# Patient Record
Sex: Male | Born: 1935 | Race: White | Hispanic: No | Marital: Married
Health system: Southern US, Community
[De-identification: ages and names within clinical notes are randomized; demographics above are authoritative.]

## PROBLEM LIST (undated history)

## (undated) DIAGNOSIS — I10 Essential (primary) hypertension: Secondary | ICD-10-CM

## (undated) DIAGNOSIS — I4891 Unspecified atrial fibrillation: Secondary | ICD-10-CM

## (undated) DIAGNOSIS — M109 Gout, unspecified: Secondary | ICD-10-CM

---

## 2007-02-19 ENCOUNTER — Emergency Department: Payer: Self-pay | Admitting: Emergency Medicine

## 2012-01-16 ENCOUNTER — Emergency Department: Payer: Self-pay | Admitting: Emergency Medicine

## 2012-01-16 LAB — CBC
HCT: 38.9 % — ABNORMAL LOW (ref 40.0–52.0)
HGB: 13.2 g/dL (ref 13.0–18.0)
MCH: 37.7 pg — ABNORMAL HIGH (ref 26.0–34.0)
MCHC: 34 g/dL (ref 32.0–36.0)
MCV: 111 fL — ABNORMAL HIGH (ref 80–100)
RBC: 3.5 10*6/uL — ABNORMAL LOW (ref 4.40–5.90)

## 2012-01-16 LAB — COMPREHENSIVE METABOLIC PANEL
Calcium, Total: 11 mg/dL — ABNORMAL HIGH (ref 8.5–10.1)
Chloride: 105 mmol/L (ref 98–107)
Co2: 24 mmol/L (ref 21–32)
Creatinine: 1.87 mg/dL — ABNORMAL HIGH (ref 0.60–1.30)
Potassium: 4.7 mmol/L (ref 3.5–5.1)
Sodium: 141 mmol/L (ref 136–145)

## 2012-01-17 LAB — URINALYSIS, COMPLETE
Bacteria: NONE SEEN
Blood: NEGATIVE
Hyaline Cast: 4
Leukocyte Esterase: NEGATIVE
Nitrite: NEGATIVE
Ph: 6 (ref 4.5–8.0)
Squamous Epithelial: 1

## 2012-01-17 LAB — CK TOTAL AND CKMB (NOT AT ARMC)
CK, Total: 117 U/L (ref 35–232)
CK-MB: 5.6 ng/mL — ABNORMAL HIGH (ref 0.5–3.6)

## 2012-02-02 ENCOUNTER — Ambulatory Visit: Payer: Medicare Other | Admitting: *Deleted

## 2012-02-10 ENCOUNTER — Other Ambulatory Visit: Payer: Self-pay | Admitting: Podiatry

## 2012-02-10 ENCOUNTER — Ambulatory Visit: Payer: Self-pay | Admitting: Podiatry

## 2012-02-14 LAB — WOUND AEROBIC CULTURE

## 2012-02-20 ENCOUNTER — Other Ambulatory Visit: Payer: Self-pay | Admitting: Ophthalmology

## 2012-02-20 LAB — POTASSIUM: Potassium: 3.8 mmol/L (ref 3.5–5.1)

## 2012-03-09 ENCOUNTER — Ambulatory Visit: Payer: Self-pay | Admitting: Ophthalmology

## 2012-05-05 ENCOUNTER — Ambulatory Visit: Payer: Self-pay | Admitting: Unknown Physician Specialty

## 2012-06-03 ENCOUNTER — Emergency Department: Payer: Self-pay | Admitting: Emergency Medicine

## 2012-06-03 LAB — COMPREHENSIVE METABOLIC PANEL
Albumin: 3.5 g/dL (ref 3.4–5.0)
Alkaline Phosphatase: 77 U/L (ref 50–136)
Anion Gap: 11 (ref 7–16)
BUN: 34 mg/dL — ABNORMAL HIGH (ref 7–18)
Creatinine: 1.41 mg/dL — ABNORMAL HIGH (ref 0.60–1.30)
Glucose: 204 mg/dL — ABNORMAL HIGH (ref 65–99)
Osmolality: 285 (ref 275–301)
Potassium: 4.6 mmol/L (ref 3.5–5.1)
Sodium: 136 mmol/L (ref 136–145)
Total Protein: 7.8 g/dL (ref 6.4–8.2)

## 2012-06-03 LAB — URINALYSIS, COMPLETE
Blood: NEGATIVE
Glucose,UR: NEGATIVE mg/dL (ref 0–75)
Leukocyte Esterase: NEGATIVE
Nitrite: NEGATIVE
Ph: 5 (ref 4.5–8.0)
Protein: 30

## 2012-06-03 LAB — CK TOTAL AND CKMB (NOT AT ARMC)
CK, Total: 48 U/L (ref 35–232)
CK-MB: 2 ng/mL (ref 0.5–3.6)

## 2012-06-03 LAB — CBC
HGB: 14 g/dL (ref 13.0–18.0)
MCH: 37.8 pg — ABNORMAL HIGH (ref 26.0–34.0)
Platelet: 178 10*3/uL (ref 150–440)
RDW: 15.2 % — ABNORMAL HIGH (ref 11.5–14.5)

## 2012-06-07 ENCOUNTER — Inpatient Hospital Stay: Payer: Self-pay | Admitting: Internal Medicine

## 2012-06-07 LAB — CBC
HCT: 38.5 % — ABNORMAL LOW (ref 40.0–52.0)
HGB: 12.9 g/dL — ABNORMAL LOW (ref 13.0–18.0)
MCHC: 34.1 g/dL (ref 32.0–36.0)
MCV: 114 fL — ABNORMAL HIGH (ref 80–100)
MCV: 114 fL — ABNORMAL HIGH (ref 80–100)
Platelet: 159 10*3/uL (ref 150–440)
RBC: 3.37 10*6/uL — ABNORMAL LOW (ref 4.40–5.90)
WBC: 16.1 10*3/uL — ABNORMAL HIGH (ref 3.8–10.6)

## 2012-06-07 LAB — URINALYSIS, COMPLETE
Bilirubin,UR: NEGATIVE
Blood: NEGATIVE
Glucose,UR: NEGATIVE mg/dL (ref 0–75)
Leukocyte Esterase: NEGATIVE
Ph: 5 (ref 4.5–8.0)
Specific Gravity: 1.016 (ref 1.003–1.030)
Squamous Epithelial: NONE SEEN

## 2012-06-07 LAB — COMPREHENSIVE METABOLIC PANEL
Albumin: 3.4 g/dL (ref 3.4–5.0)
BUN: 46 mg/dL — ABNORMAL HIGH (ref 7–18)
Chloride: 106 mmol/L (ref 98–107)
Co2: 26 mmol/L (ref 21–32)
Creatinine: 1.62 mg/dL — ABNORMAL HIGH (ref 0.60–1.30)
SGPT (ALT): 23 U/L
Sodium: 140 mmol/L (ref 136–145)

## 2012-06-07 LAB — CK TOTAL AND CKMB (NOT AT ARMC)
CK, Total: 59 U/L (ref 35–232)
CK-MB: 2.7 ng/mL (ref 0.5–3.6)

## 2012-06-07 LAB — TROPONIN I: Troponin-I: 0.03 ng/mL

## 2012-06-08 LAB — CBC WITH DIFFERENTIAL/PLATELET
Basophil %: 0.2 %
Eosinophil %: 3.3 %
HGB: 11.2 g/dL — ABNORMAL LOW (ref 13.0–18.0)
Lymphocyte #: 1.8 10*3/uL (ref 1.0–3.6)
MCH: 39.3 pg — ABNORMAL HIGH (ref 26.0–34.0)
MCV: 113 fL — ABNORMAL HIGH (ref 80–100)
Monocyte #: 1 x10 3/mm (ref 0.2–1.0)
Monocyte %: 10.8 %
Neutrophil %: 66.5 %
Platelet: 118 10*3/uL — ABNORMAL LOW (ref 150–440)
RBC: 2.84 10*6/uL — ABNORMAL LOW (ref 4.40–5.90)
WBC: 9.2 10*3/uL (ref 3.8–10.6)

## 2012-06-08 LAB — COMPREHENSIVE METABOLIC PANEL
Albumin: 2.7 g/dL — ABNORMAL LOW (ref 3.4–5.0)
BUN: 31 mg/dL — ABNORMAL HIGH (ref 7–18)
Bilirubin,Total: 1.1 mg/dL — ABNORMAL HIGH (ref 0.2–1.0)
EGFR (African American): >60
Glucose: 117 mg/dL — ABNORMAL HIGH (ref 65–99)
SGOT(AST): 31 U/L (ref 15–37)
SGPT (ALT): 19 U/L
Total Protein: 6 g/dL — ABNORMAL LOW (ref 6.4–8.2)

## 2012-06-08 LAB — PROTIME-INR: Prothrombin Time: 15.4 secs — ABNORMAL HIGH (ref 11.5–14.7)

## 2012-06-09 LAB — CBC WITH DIFFERENTIAL/PLATELET
Eosinophil %: 5 %
Lymphocyte #: 1.7 10*3/uL (ref 1.0–3.6)
MCV: 113 fL — ABNORMAL HIGH (ref 80–100)
Monocyte %: 13.3 %
Platelet: 112 10*3/uL — ABNORMAL LOW (ref 150–440)
RBC: 2.81 10*6/uL — ABNORMAL LOW (ref 4.40–5.90)
WBC: 7.2 10*3/uL (ref 3.8–10.6)

## 2012-06-26 ENCOUNTER — Encounter (HOSPITAL_COMMUNITY): Payer: Self-pay | Admitting: *Deleted

## 2012-06-26 ENCOUNTER — Emergency Department (HOSPITAL_COMMUNITY)
Admission: EM | Admit: 2012-06-26 | Discharge: 2012-06-27 | Disposition: A | Discharge status: Home / Self Care | Payer: Medicare Other | Attending: Emergency Medicine | Admitting: Emergency Medicine

## 2012-06-26 DIAGNOSIS — Z79899 Other long term (current) drug therapy: Secondary | ICD-10-CM | POA: Insufficient documentation

## 2012-06-26 DIAGNOSIS — E860 Dehydration: Secondary | ICD-10-CM

## 2012-06-26 DIAGNOSIS — K5730 Diverticulosis of large intestine without perforation or abscess without bleeding: Secondary | ICD-10-CM | POA: Insufficient documentation

## 2012-06-26 DIAGNOSIS — R112 Nausea with vomiting, unspecified: Secondary | ICD-10-CM | POA: Insufficient documentation

## 2012-06-26 DIAGNOSIS — I10 Essential (primary) hypertension: Secondary | ICD-10-CM | POA: Insufficient documentation

## 2012-06-26 DIAGNOSIS — E119 Type 2 diabetes mellitus without complications: Secondary | ICD-10-CM | POA: Insufficient documentation

## 2012-06-26 DIAGNOSIS — N289 Disorder of kidney and ureter, unspecified: Secondary | ICD-10-CM | POA: Insufficient documentation

## 2012-06-26 HISTORY — DX: Unspecified atrial fibrillation: I48.91

## 2012-06-26 HISTORY — DX: Gout, unspecified: M10.9

## 2012-06-26 HISTORY — DX: Essential (primary) hypertension: I10

## 2012-06-26 NOTE — ED Notes (Signed)
Pt states that he has been intermittantly vomiting for the past month. Pt states that there are a few days that he goes without vomiting but then there are days when he vomits multiple times. Pt states vomited 3 times today. Pt son states gave pt medications tonight unsure how many he kept down.

## 2012-06-26 NOTE — ED Provider Notes (Signed)
History     CSN: [id number]  Arrival date & time 06/26/12  2149   First MD Initiated Contact with Patient 06/26/12 2321      Chief Complaint  Patient presents with  . Emesis    (Consider location/radiation/quality/duration/timing/severity/associated sxs/prior treatment) HPI 78 year old male presents to the emergency department in the company of his son with complaint of nausea and vomiting intermittently over the last month. Patient was seen at [facility] emergency department twice, admitted once. They're unsure what interventions were done for the patient. They report he has had persistent symptoms despite nausea medication. No fever no chills no abdominal pain. Vomiting happens without relation to eating or drinking. Today, patient has vomited 3 times. Family thinks he has had a 10 pound weight loss. No prior history of gastroenterologist evaluation. Patient with history of diabetes, denies history of gastroparesis. No prior surgeries. Patient also complaining of ongoing left leg pain, has been told he has gout and Baker's cyst. No change in his ongoing pain in the knee/leg  Past Medical History  Diagnosis Date  . Diabetes mellitus   . Hypertension   . Gout   . A-fib     History reviewed. No pertinent past surgical history.  History reviewed. No pertinent family history.  History  Substance Use Topics  . Smoking status: Not on file  . Smokeless tobacco: Not on file  . Alcohol Use:       Review of Systems  All other systems reviewed and are negative.  other than listed in HPI  Allergies  Review of patient's allergies indicates no known allergies.  Home Medications   Current Outpatient Rx  Name Route Sig Dispense Refill  . ALLOPURINOL 100 MG PO TABS Oral Take 100 mg by mouth daily.    Marland Kitchen DILTIAZEM HCL ER 240 MG PO CP24 Oral Take 240 mg by mouth daily.    Marland Kitchen HYDROCHLOROTHIAZIDE 25 MG PO TABS Oral Take 25 mg by mouth daily.    Marland Kitchen HYDROCODONE-ACETAMINOPHEN  10-325 MG PO TABS Oral Take 1 tablet by mouth 2 (two) times daily as needed. For pain    . METFORMIN HCL ER 500 MG PO TB24 Oral Take 500-1,000 mg by mouth 2 (two) times daily. Take 500mg  in AM and 1000mg  in PM    . ONDANSETRON 4 MG PO TBDP Oral Take 4 mg by mouth every 8 (eight) hours as needed. For nausea    . PANTOPRAZOLE SODIUM 40 MG PO TBEC Oral Take 40 mg by mouth daily.    . QUINAPRIL HCL 40 MG PO TABS Oral Take 40 mg by mouth at bedtime.      BP 136/61  Temp 98.3 F (36.8 C) (Oral)  Resp 18  SpO2 100%  Physical Exam  Nursing note and vitals reviewed. Constitutional: He is oriented to person, place, and time. He appears well-developed. No distress.       Thin appearing  HENT:  Head: Normocephalic and atraumatic.  Nose: Nose normal.  Mouth/Throat: Oropharynx is clear and moist.  Eyes: Conjunctivae and EOM are normal. Pupils are equal, round, and reactive to light.  Neck: Normal range of motion. Neck supple. No JVD present. No tracheal deviation present. No thyromegaly present.  Cardiovascular: Normal rate, regular rhythm, normal heart sounds and intact distal pulses.  Exam reveals no gallop and no friction rub.   No murmur heard. Pulmonary/Chest: Effort normal and breath sounds normal. No stridor. No respiratory distress. He has no wheezes. He has no rales. He exhibits no  tenderness.  Abdominal: Soft. Bowel sounds are normal. He exhibits no distension and no mass. There is no tenderness. There is no rebound and no guarding.  Musculoskeletal: Normal range of motion. He exhibits no edema and no tenderness.  Lymphadenopathy:    He has no cervical adenopathy.  Neurological: He is alert and oriented to person, place, and time. He exhibits normal muscle tone. Coordination normal.  Skin: Skin is warm and dry. No rash noted. He is not diaphoretic. No erythema. No pallor.  Psychiatric: He has a normal mood and affect. His behavior is normal. Judgment and thought content normal.    ED  Course  Procedures (including critical care time)  Labs Reviewed  COMPREHENSIVE METABOLIC PANEL - Abnormal; Notable for the following:    Glucose, Bld 179 (*)     BUN 36 (*)     Creatinine, Ser 1.40 (*)     Calcium 11.6 (*)     Albumin 3.2 (*)     GFR calc non Af Amer 43 (*)     GFR calc Af Amer 49 (*)     All other components within normal limits  LIPASE, BLOOD - Abnormal; Notable for the following:    Lipase 61 (*)     All other components within normal limits  URINALYSIS, ROUTINE W REFLEX MICROSCOPIC - Abnormal; Notable for the following:    Bilirubin Urine SMALL (*)     Ketones, ur 15 (*)     All other components within normal limits  CBC WITH DIFFERENTIAL - Abnormal; Notable for the following:    WBC 13.1 (*)     RBC 3.43 (*)     HCT 37.0 (*)     MCV 107.9 (*)     MCH 38.5 (*)     Neutrophils Relative 84 (*)     Neutro Abs 11.1 (*)     Lymphocytes Relative 6 (*)     Monocytes Absolute 1.1 (*)     All other components within normal limits  GLUCOSE, CAPILLARY - Abnormal; Notable for the following:    Glucose-Capillary 169 (*)     All other components within normal limits   Ct Abdomen Pelvis Wo Contrast  06/27/2012  *RADIOLOGY REPORT*  Clinical Data: Nausea and vomiting.  CT ABDOMEN AND PELVIS WITHOUT CONTRAST  Technique:  Multidetector CT imaging of the abdomen and pelvis was performed following the standard protocol without intravenous contrast.  Comparison: None.  Findings: Fibrotic changes at the lung bases.  Large hiatal hernia.  Organ abnormality/lesion detection is limited in the absence of intravenous contrast. Within this limitation, unremarkable liver, biliary system, adrenal glands.  Splenic granuloma. Pancreas is poorly characterized.  No significant peripancreatic fat stranding. No peripancreatic fluid.  Renal calcifications and indeterminate renal lesions.  No hydronephrosis or hydroureter.  No bowel obstruction.  Colonic diverticulosis.  No definite evidence for  diverticulitis.  Appendix not identified.  No free intraperitoneal air or fluid.  There is scattered atherosclerotic calcification of the aorta and its branches. No aneurysmal dilatation. Nonspecific prominent perisplenic collateral vessels.  Partially decompressed bladder.  Prostatomegaly.  High-riding testicles.  Osteopenia.  Multilevel degenerative changes. No compression fracture with greater than 50% central height loss.  IMPRESSION:  Large hiatal hernia.  Colonic diverticulosis.  Age indeterminate L1 compression fracture without significant retropulsion.  Correlate with point tenderness.  Prominent perisplenic collateral vessels, nonspecific.  Further vascular evaluation is not possible given the absence of intravenous contrast.  Original Report Authenticated By: Waneta Martins, M.D.  1. Nausea and vomiting   2. Renal insufficiency   3. Dehydration       MDM  78 year old with ongoing nausea and vomiting. We'll get prior records from [facility]. We'll get baseline labs here. Patient is seen by Dr. Felipa Eth.  Pt with stable renal insufficiency from labs at , slight ketones in urine.  No acute findings on CT scan.  Pt able to tolerate all of the contrast, no further vomiting.  Will have himf/u with [facility] this week.       Olivia Mackie, MD 06/27/12 [id number]

## 2012-06-27 ENCOUNTER — Emergency Department (HOSPITAL_COMMUNITY): Payer: Medicare Other

## 2012-06-27 LAB — COMPREHENSIVE METABOLIC PANEL
AST: 30 U/L (ref 0–37)
Albumin: 3.2 g/dL — ABNORMAL LOW (ref 3.5–5.2)
Alkaline Phosphatase: 56 U/L (ref 39–117)
Chloride: 100 mEq/L (ref 96–112)
Potassium: 4.5 mEq/L (ref 3.5–5.1)
Sodium: 139 mEq/L (ref 135–145)
Total Bilirubin: 0.7 mg/dL (ref 0.3–1.2)
Total Protein: 7 g/dL (ref 6.0–8.3)

## 2012-06-27 LAB — CBC WITH DIFFERENTIAL/PLATELET
Basophils Relative: 0 % (ref 0–1)
HCT: 37 % — ABNORMAL LOW (ref 39.0–52.0)
Hemoglobin: 13.2 g/dL (ref 13.0–17.0)
Lymphs Abs: 0.8 10*3/uL (ref 0.7–4.0)
MCH: 38.5 pg — ABNORMAL HIGH (ref 26.0–34.0)
MCHC: 35.7 g/dL (ref 30.0–36.0)
Monocytes Absolute: 1.1 10*3/uL — ABNORMAL HIGH (ref 0.1–1.0)
Monocytes Relative: 8 % (ref 3–12)
Neutro Abs: 11.1 10*3/uL — ABNORMAL HIGH (ref 1.7–7.7)
Neutrophils Relative %: 84 % — ABNORMAL HIGH (ref 43–77)
RBC: 3.43 MIL/uL — ABNORMAL LOW (ref 4.22–5.81)

## 2012-06-27 LAB — URINALYSIS, ROUTINE W REFLEX MICROSCOPIC
Glucose, UA: NEGATIVE mg/dL
Ketones, ur: 15 mg/dL — AB
Leukocytes, UA: NEGATIVE
Nitrite: NEGATIVE
Specific Gravity, Urine: 1.02 (ref 1.005–1.030)
pH: 5.5 (ref 5.0–8.0)

## 2012-06-27 MED ORDER — IOHEXOL 300 MG/ML  SOLN
20.0000 mL | INTRAMUSCULAR | Status: AC
  Administered 2012-06-27: 20 mL via ORAL

## 2012-06-27 MED ORDER — SODIUM CHLORIDE 0.9 % IV BOLUS (SEPSIS)
1000.0000 mL | Freq: Once | INTRAVENOUS | Status: AC
  Administered 2012-06-27: 1000 mL via INTRAVENOUS

## 2012-06-27 MED ORDER — ONDANSETRON HCL 4 MG PO TABS: 4.0000 mg | ORAL_TABLET | Freq: Three times a day (TID) | ORAL | Status: AC

## 2012-06-27 NOTE — ED Notes (Signed)
The pt just had 2 cups of contrast brought.  One cup each hour

## 2012-06-27 NOTE — ED Notes (Signed)
Pt returned from ct

## 2012-06-27 NOTE — ED Notes (Signed)
To the br then to c-t

## 2012-06-27 NOTE — ED Notes (Signed)
Pt up to the br.  2nd cup of oral contrast  started

## 2012-06-27 NOTE — ED Notes (Signed)
The pt remains alert voiding frequently.,  His son is at the bedside. Iv started

## 2012-06-27 NOTE — ED Notes (Signed)
The pt has been ill for 3 weeks with intermittent nausea vomiting and diarrhea.  At present alert no acute distress

## 2012-06-27 NOTE — ED Notes (Signed)
Call made to c-t one hour ago telling them that the pt had finished his contrast.  sleeping

## 2012-08-31 DEATH — deceased

## 2013-04-13 IMAGING — CT CT ABD-PELV W/O CM
2 of 4 series · 14 of 32 positions shown, 19 images · non-contrast
Comparison: None.

CLINICAL DATA: Nausea and vomiting.

CT ABDOMEN AND PELVIS WITHOUT CONTRAST
TECHNIQUE: Multidetector CT imaging of the abdomen and pelvis was
performed following the standard protocol without intravenous
contrast.

[Series 2: routine abdomen · axial · 0.70mm/px · z∈[-381,-101]mm · 7 of 76 slices shown, 12 images]
[im 10/76  soft-tissue]
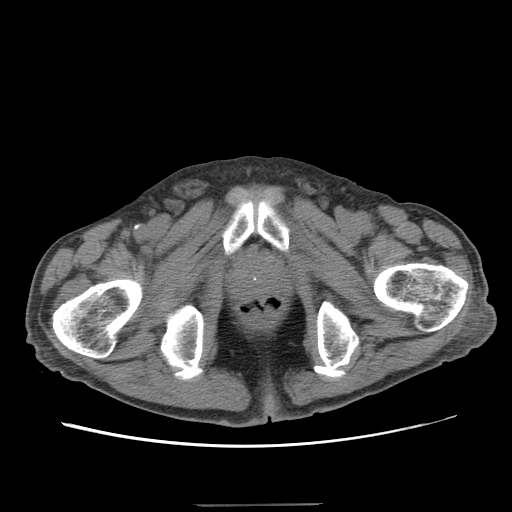
[im 10/76  bone]
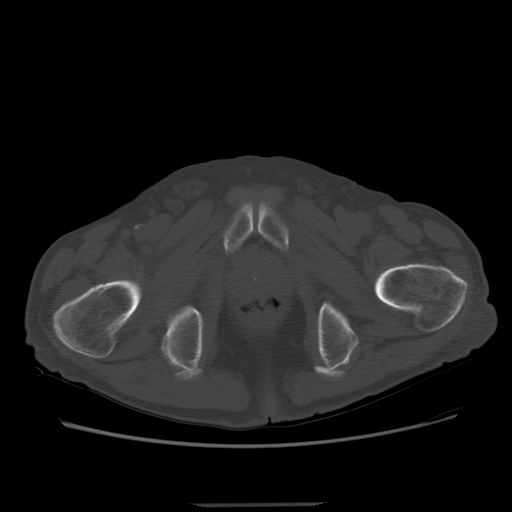
[im 19/76  soft-tissue]
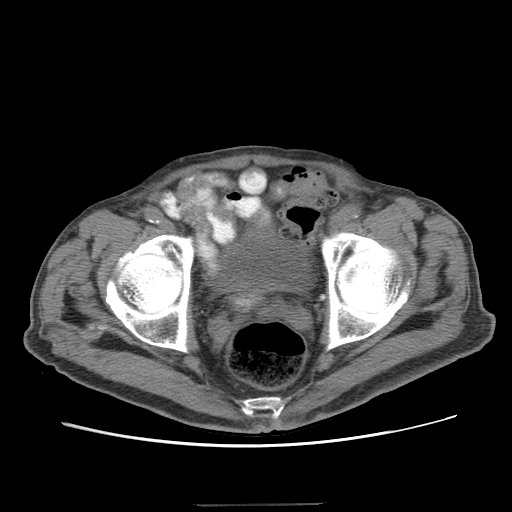
[im 29/76  soft-tissue]
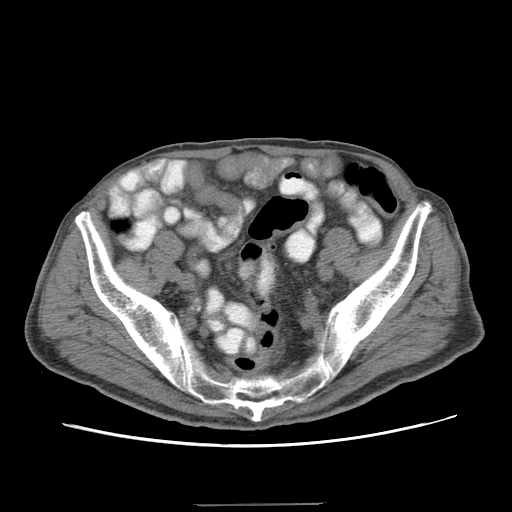
[im 38/76  soft-tissue]
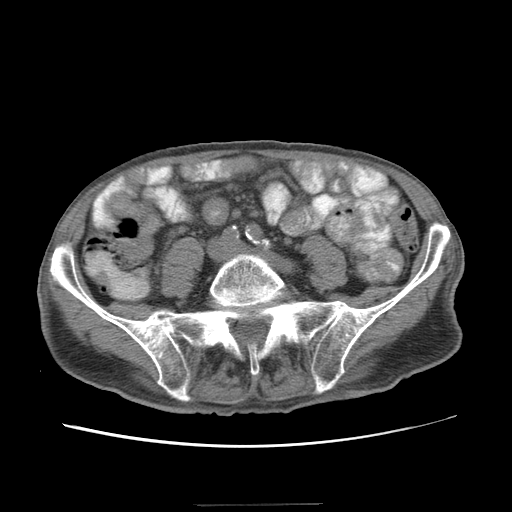
[im 38/76  lung]
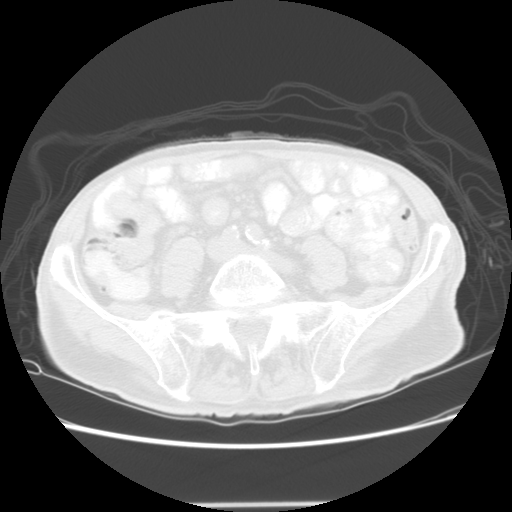
[im 47/76  soft-tissue]
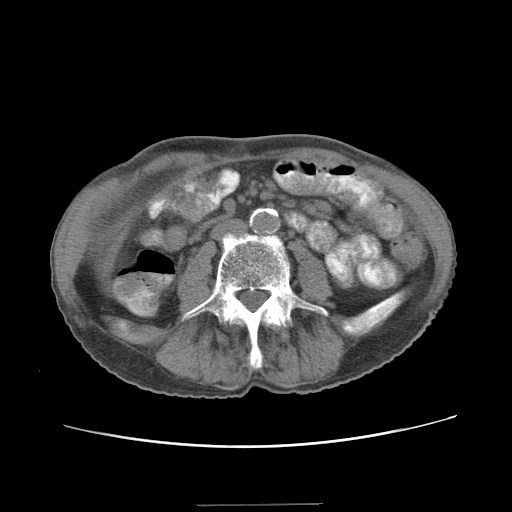
[im 47/76  lung]
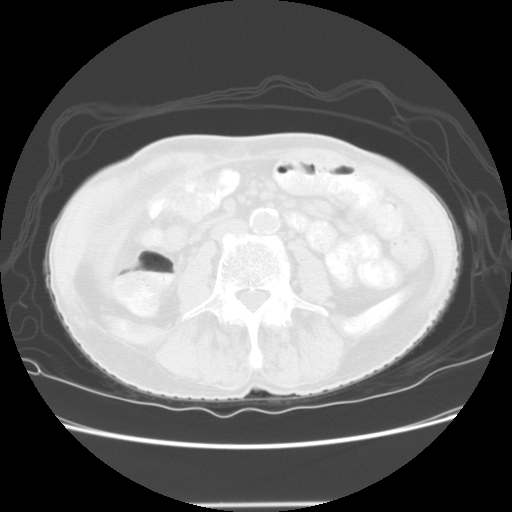
[im 57/76  soft-tissue]
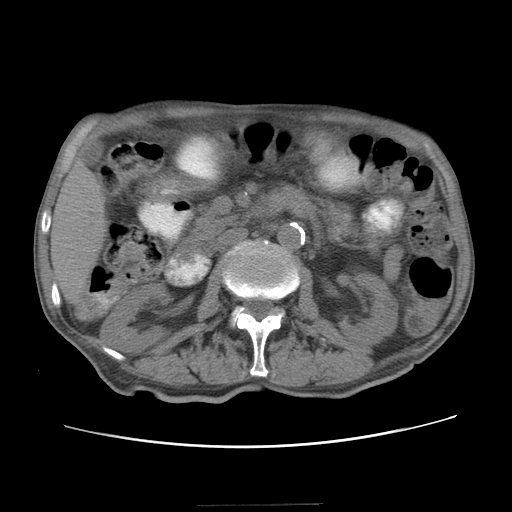
[im 57/76  lung]
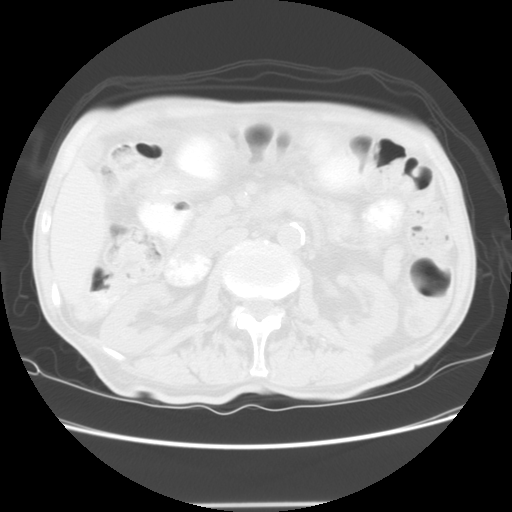
[im 66/76  soft-tissue]
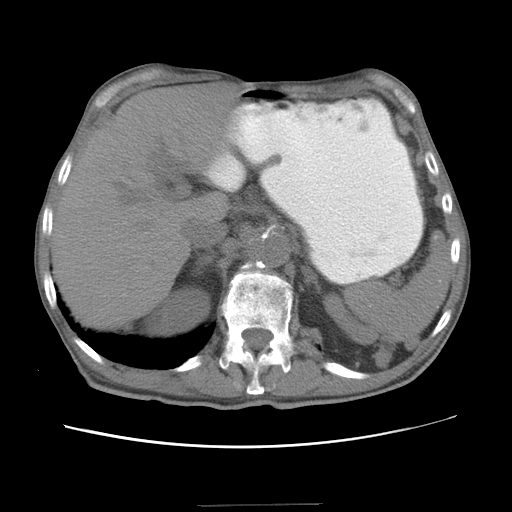
[im 66/76  lung]
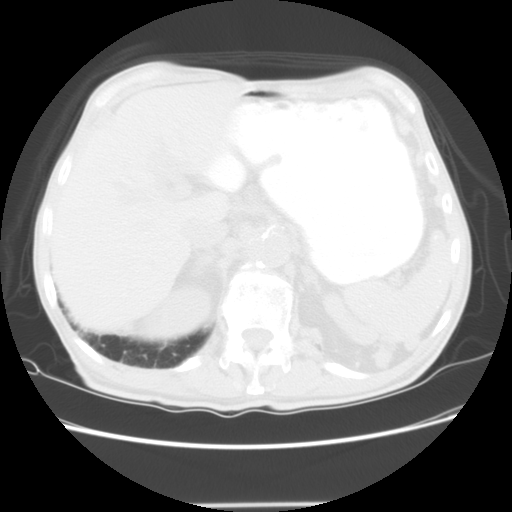

[Series 404: sagitals · sagittal · 0.75mm/px · 7 of 101 slices shown]
[im 11/101  soft-tissue]
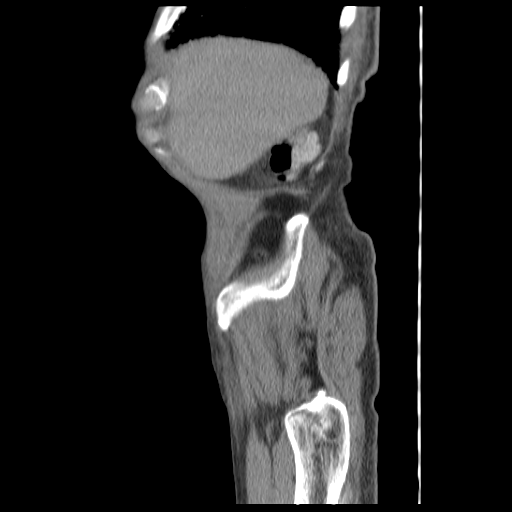
[im 21/101  soft-tissue]
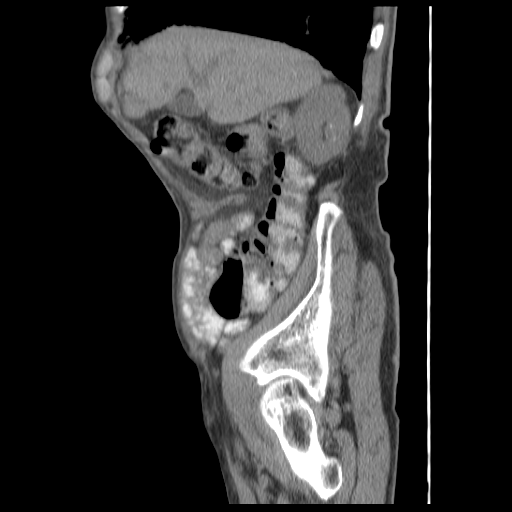
[im 31/101  soft-tissue]
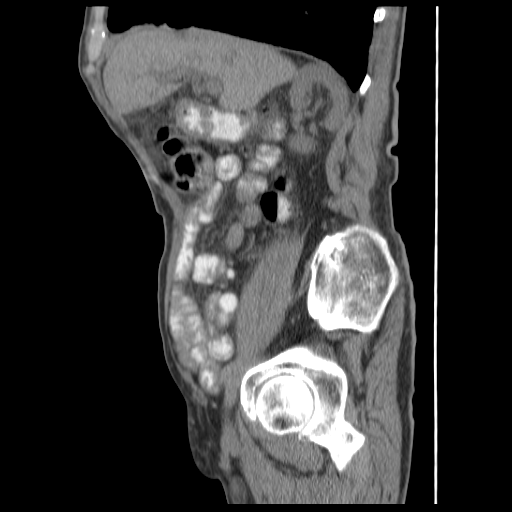
[im 41/101  soft-tissue]
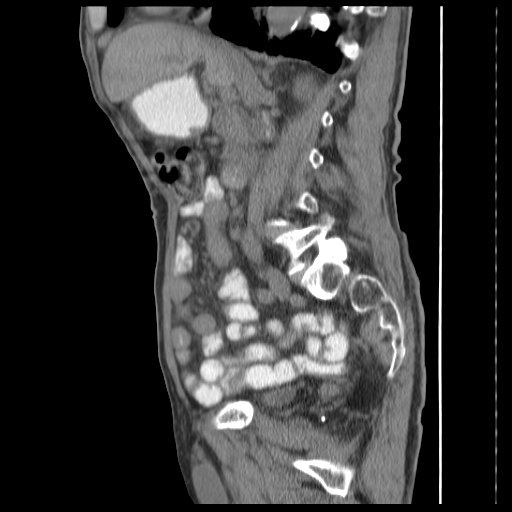
[im 61/101  soft-tissue]
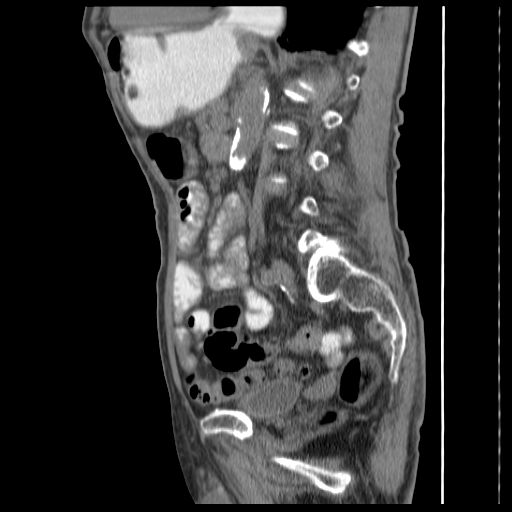
[im 71/101  soft-tissue]
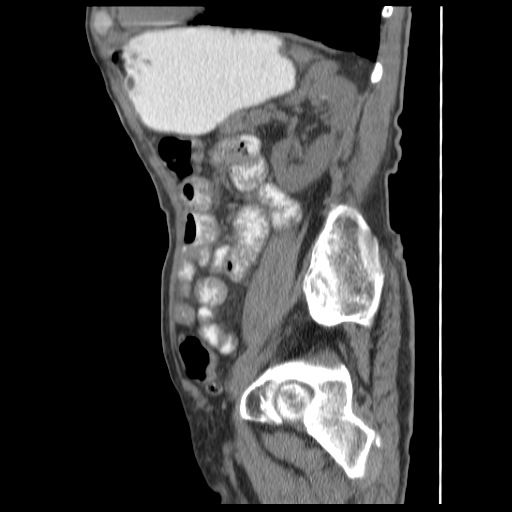
[im 81/101  soft-tissue]
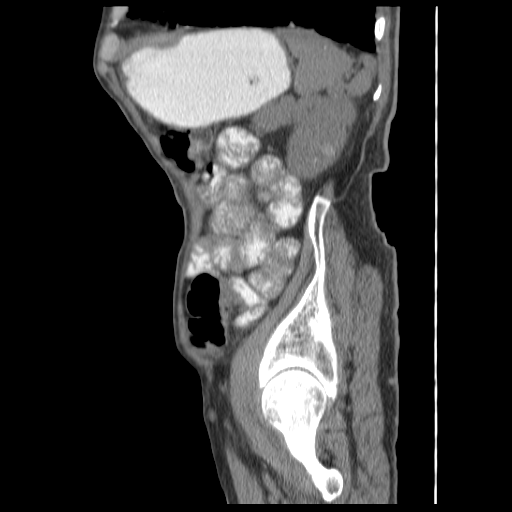

[14 of 32 positions shown; findings below may reference images not displayed]

FINDINGS: [Fibrotic changes at the lung bases.  Large hiatal
hernia.

Organ abnormality/lesion detection is limited in the absence of
intravenous contrast. Within this limitation, unremarkable liver,
biliary system, adrenal glands.  Splenic granuloma. Pancreas is
poorly characterized.  No significant peripancreatic fat stranding.
No peripancreatic fluid.

Renal calcifications and indeterminate renal lesions.  No
hydronephrosis or hydroureter.

No bowel obstruction.  Colonic diverticulosis.  No definite
evidence for diverticulitis.  Appendix not identified.  No free
intraperitoneal air or fluid.

There is scattered atherosclerotic calcification of the aorta and
its branches. No aneurysmal dilatation. Nonspecific prominent
perisplenic collateral vessels.

Partially decompressed bladder.  Prostatomegaly.  High-riding
testicles.

Osteopenia.  Multilevel degenerative changes.] No compression
fracture with greater than 50% central height loss.
IMPRESSION: Large hiatal hernia.

Colonic diverticulosis.

Age indeterminate L1 compression fracture without significant
retropulsion.  Correlate with point tenderness.

Prominent perisplenic collateral vessels, nonspecific.  Further
vascular evaluation is not possible given the absence of
intravenous contrast.

## 2015-03-25 NOTE — Op Note (Signed)
PATIENT NAME:  Samuel Brooks, [name] MR#:  [id number] DATE OF BIRTH:  January 20, 1920  DATE OF PROCEDURE:  03/09/2012  PREOPERATIVE DIAGNOSIS: Visually significant cataract of the left eye.   POSTOPERATIVE DIAGNOSIS: Visually significant cataract of the left eye.   OPERATIVE PROCEDURE: Cataract extraction by phacoemulsification with implant of intraocular lens to left eye.   SURGEON: Galen ManilaWilliam Paxton Kanaan, MD.   ANESTHESIA:  1. Managed anesthesia care.  2. Topical tetracaine drops followed by 2% Xylocaine jelly applied in the preoperative holding area.   COMPLICATIONS: None.   TECHNIQUE:  Stop-and-chop    DESCRIPTION OF PROCEDURE: The patient was examined and consented in the preoperative holding area where the aforementioned topical anesthesia was applied to the left eye and then brought back to the Operating Room where the left eye was prepped and draped in the usual sterile ophthalmic fashion and a lid speculum was placed. A paracentesis was created with the side port blade and the anterior chamber was filled with viscoelastic. A near clear corneal incision was performed with the steel keratome. A continuous curvilinear capsulorrhexis was performed with a cystotome followed by the capsulorrhexis forceps. Hydrodissection and hydrodelineation were carried out with BSS on a blunt cannula. The lens was removed in a stop-and-chop technique and the remaining cortical material was removed with the irrigation-aspiration handpiece. The capsular bag was inflated with viscoelastic and the Technus ZCB00  21.0-diopter lens, serial number [id number] was placed in the capsular bag without complication. The remaining viscoelastic was removed from the eye with the irrigation-aspiration handpiece. The wounds were hydrated. The anterior chamber was flushed with Miostat and the eye was inflated to physiologic pressure. The wounds were found to be water tight. The eye was dressed with Vigamox. The patient was given protective  glasses to wear throughout the day and a shield with which to sleep tonight. The patient was also given drops with which to begin a drop regimen today and will follow-up with me in one day.   ____________________________ Jerilee FieldWilliam L. Braian Tijerina, MD wlp:drc D: 03/09/2012 12:55:05 ET T: 03/09/2012 13:18:04 ET JOB#: [id number]  cc: Neveah Bang L. Smrithi Pigford, MD, <Dictator> Jerilee FieldWILLIAM L Hesham Womac MD ELECTRONICALLY SIGNED 03/10/2012 11:42

## 2015-03-25 NOTE — Discharge Summary (Signed)
PATIENT NAME:  Samuel Brooks, Samuel Brooks MR#:  [id number] DATE OF BIRTH:  1920/11/30  DATE OF ADMISSION:  06/07/2012 DATE OF DISCHARGE:  06/09/2012  PRIMARY CARE PHYSICIAN: Dr. Felipa [facility] - [place]  DISCHARGE DIAGNOSES: 1. Gastritis.  2. Dehydration.  3. Leukocytosis.  4. Acute renal failure.  5. Chronic microcytic anemia.  6. Sinus bradycardia.   CONSULTANTS: Lurline DelShaukat Iftikhar, MD - Gastroenterology.   PROCEDURES: None.  IMAGING STUDIES: Chest x-ray and abdominal x-ray showed no acute abnormalities.   ADMITTING HISTORY AND PHYSICAL/HOSPITAL COURSE: This is a 81 year old gentleman with history of hypertension who presented to the Emergency Room complaining of nausea, vomiting, and some coffee ground emesis. The patient was found to be acutely dehydrated and admitted onto the medical floor for further management. The patient did well during the hospital stay initially being n.p.o. then been being transitioned on to clear liquids and on the day of discharge tolerating solid foods. Dr. Niel HummerIftikhar of gastroenterology saw the patient who suggested this could be secondary to some mild gastritis as his hemoglobin has been stable. The patient did have some mild acute renal failure secondary to dehydration from the nausea and vomiting and decreased intake which resolved with IV fluids. The patient is on the day of discharge tolerating solid food, no abdominal pain, nausea or vomiting, and no shortness of breath. He has vitals in normal range and is being discharged home in a stable condition. His Cardizem is being held as he had some mild sinus bradycardia in the 50s, which was asymptomatic.    DISCHARGE MEDICATIONS: 1. Acetaminophen/hydrocodone 325/10 mg oral two times a day as needed.  2. Allopurinol 100 mg oral 1 tablet twice a day. 3. Hydrochlorothiazide 25 mg oral once a day.  4. Metformin 500 mg 1 tablet oral two times a day. 5. MiraLax 17 grams oral once a day as needed. 6. Protonix 40 mg orally 1  tablet daily. 7. Quinapril 40 mg 1 tablet orally daily.   DISCHARGE INSTRUCTIONS: Followup with primary care physician within a week. Cardiac diet. Activity as tolerated with assistance at all times. The patient has been set up with home health for physical therapy as recommended by the physical therapy team in the hospital for weakness. This plan was discussed with the patient and his daughter at bedside who verbalized understanding and are okay with the plan.  TIME SPENT: Time spent on discharge dictation along with Coordinating care and counseling of the patient was greater than 35 minutes. ____________________________ Molinda BailiffSrikar R. Tavares Levinson, MD srs:slb D: 06/09/2012 15:02:00 ET T: 06/10/2012 10:09:17 ET JOB#: [id number]  cc: Wardell HeathSrikar R. Jazmeen Axtell, MD, <Dictator> Dr. [facility] Orie FishermanSRIKAR R Shacoria Latif MD ELECTRONICALLY SIGNED 06/11/2012 14:14

## 2015-03-25 NOTE — Consult Note (Signed)
PATIENT NAME:  Samuel Brooks, Samuel Brooks MR#:  [id number] DATE OF BIRTH:  02/10/1920  DATE OF CONSULTATION:  06/08/2012  REFERRING PHYSICIAN:   CONSULTING PHYSICIAN:  Lurline DelShaukat Keandre Linden, MD  PRIMARY CARE PHYSICIAN: Dr. Terance HartBronstein   REASON FOR CONSULTATION: Nausea, vomiting and questionable coffee-ground emesis.   HISTORY OF PRESENT ILLNESS: 81 year old male with history of gout, hypertension, hyperlipidemia, type 2 diabetes, nephrolithiasis, etc. Patient initially came to the Emergency Room four days ago with nausea and vomiting. Serum lipase and other labs were quite unremarkable. Patient was discharged from the Emergency Room. Apparently patient continued to have nausea and vomiting and was brought back to the Emergency Room yesterday and was subsequently admitted. Family also has reported some coffee-ground emesis although there has been no history of hematochezia or melena. Patient's hemoglobin was normal on admission, which has dropped down slightly to around 11.5 with hydration. Patient denies any further nausea or vomiting today. He denies any coffee-ground emesis and he denies any bowel movements today. He is tolerating clear liquid diet without any problems. Patient has apparently never had an upper GI endoscopy and this illness started rather acutely. He denies using over-the-counter nonsteroidals.   PAST MEDICAL HISTORY:  1. Gout. 2. Hypertension. 3. Hyperlipidemia. 4. Nephrolithiasis. 5. Type 2 diabetes.  6. Appendectomy.  7. Cataract surgery.   HOME MEDICATIONS:  1. Quinapril. 2. MiraLax. 3. Metformin. 4. Hydrochlorothiazide. 5. Cardizem. 6. Allopurinol.  7. Norco.   ALLERGIES: None.   SOCIAL HISTORY: Does not smoke or drink.   FAMILY HISTORY: Unremarkable.   REVIEW OF SYSTEMS: Grossly negative except for what is mentioned in the History of Present Illness.   PHYSICAL EXAMINATION:  GENERAL: Somewhat thin male, does not appear to be in any acute distress.   VITAL SIGNS:  Temperature 98.5, pulse 68, respirations 18, blood pressure 144/80.   HEENT: Unremarkable.   NECK: Veins are flat.   LUNGS: Grossly clear to auscultation bilaterally with fair air entry and no added sounds.   CARDIOVASCULAR: Regular rate and rhythm. No gallops or murmur.   ABDOMEN: Quite soft and benign. Bowel sounds are positive. Nontender, nondistended. No rebound or guarding. No hepatosplenomegaly or ascites.   NEUROLOGIC: Examination appears to be grossly unremarkable.   LABORATORY, DIAGNOSTIC AND RADIOLOGICAL DATA: PT and INR within normal limits. White cell count 9.2, hemoglobin 11.2, hematocrit 32, platelet count 118. Hemoglobin on admission was 12.9. Electrolytes are fairly unremarkable. BUN 31, creatinine 1.07. Liver enzymes are unremarkable. Abdominal x-ray is unremarkable.   ASSESSMENT AND PLAN: Patient with acute onset nausea, vomiting associated with some reported coffee-ground emesis. There is no melena or hematochezia. This is most likely an acute viral gastritis. Patient's symptoms have completely resolved as of today. Patient's hemoglobin has slightly dropped with hydration although again as mentioned above, there are no signs of active gastrointestinal blood loss. More serious lesions such as peptic ulcer disease or even malignancy cannot be completely excluded although somewhat unlikely due to rapid onset and rapid resolution of his symptoms. Due to patient's advanced age and risk associated with sedation and further GI intervention I have discussed different options with the patient and his family. We have decided to continue to observe him, advance his diet gradually and if patient remains completely asymptomatic then avoid an upper GI endoscopy due to the risk of anesthesia sedation and the procedure itself. It has been made clear to the patient and the family that in that situation we may miss a more serious lesion, although again the risk/benefit ratio would  support such a  plan. On the other hand, if there are signs of continued or recurrent symptoms or if there are signs of GI bleed or significant drop in his hemoglobin and hematocrit then an upper GI endoscopy would be more beneficial and should be performed despite some risks associated with such an intervention. The family is in full agreement as well as the patient. Continue proton pump inhibitor. Will advance him to full liquid diet tomorrow morning which can be further advanced to soft diet tomorrow afternoon if well tolerated. Will follow and will make further recommendations.   ____________________________ Lurline Del, MD si:cms D: 06/08/2012 19:24:01 ET T: 06/09/2012 07:54:37 ET JOB#: [id number]  cc: Lurline Del, MD, <Dictator>  Lurline Del MD ELECTRONICALLY SIGNED 06/11/2012 15:08

## 2015-03-25 NOTE — Consult Note (Signed)
Brief Consult Note: Diagnosis: Nausea, vomiting and ? coffee ground emesis.   Patient was seen by consultant.   Consult note dictated.   Orders entered.   Comments: Patient with 3-4 days of nausea and vomiting along with dark vomitus.  No melena or hematochezia. H and H stale after a mild drop. Fairly asymptomatic today.  Impression: Probably viral gastritis with ? minimal GI blood loss. No signs of active GI bleed and he is now asymptomatic. More significant lesions such as PUD/ malignancy etc are possible as well although somewhat less likely.  Recommendations: I have discussed different options with the patient and his family. Due to advanced age and risks associated with sedation, it has been decided to continue conservative treatment and advance diet. If no further symptoms, then an EGD may carry more risks than benefits. On the other hand in case of recurrent or continued symptoms an EGD will likely be beneficial. They are in full agreement. Will advance to full liquid in am and diet can be further advanced to soft later today if remains asymptomatic. Will follow. Thanks.  Electronic Signatures: Lurline DelIftikhar, Graysen [place] (MD)  (Signed 09-Jul-13 19:18)  Authored: Brief Consult Note   Last Updated: 09-Jul-13 19:18 by Lurline DelIftikhar, Geron Mulford (MD)

## 2015-03-25 NOTE — H&P (Signed)
PATIENT NAME:  Samuel Brooks, Samuel Brooks MR#:  [id number] DATE OF BIRTH:  1920-10-08  DATE OF ADMISSION:  06/07/2012  REFERRING PHYSICIAN:  Dr. Lorenso Courier FAMILY PHYSICIAN: Dr. Terance Hart   REASON FOR ADMISSION: Intractable nausea and vomiting with coffee-ground emesis and dehydration.   HISTORY OF PRESENT ILLNESS: The patient is a 81 year old male who lives at home with his wife. The patient has a significant history of hypertension, diabetes, and gout.  He was initially seen in the Emergency Room 4 to 5 days ago with nausea and vomiting.  He was sent home. He presents now with recurrent nausea and vomiting associated with coffee-ground emesis. In the Emergency Room, the patient was noted to be lethargic and confused and was found to be more anemic than previous. He was also noted to have an elevated white blood cell count. He is now admitted for further evaluation. The patient is unable to give a history because of his lethargy and confusion.   PAST MEDICAL HISTORY:  1. Gout.  2. Benign hypertension.  3. Hyperlipidemia.  4. Nephrolithiasis.  5. Type 2 diabetes.  6. History of cataracts.  7. Status post appendectomy.  8. Degenerative disk disease.   MEDICATIONS:  1. Quinapril 40 mg p.o. daily.  2. MiraLAX 17 grams p.o. daily.  3. Metformin 500 mg p.o. b.i.d.  4. Hydrochlorothiazide 25 mg p.o. daily.  5. Cardizem CD 240 mg p.o. daily.  6. Allopurinol 100 mg p.o. daily.  7. Norco 10/325, one p.o. b.i.d. as needed for pain.   ALLERGIES: No known drug allergies.   SOCIAL HISTORY: The patient is married. No history of alcohol abuse. He has a remote history of tobacco abuse but none recently.   FAMILY HISTORY: None reported.   REVIEW OF SYSTEMS: Unable to obtain from the patient.   PHYSICAL EXAMINATION:  GENERAL: The patient is somnolent, somewhat confused, but in no acute distress.   VITAL SIGNS: Vital signs are remarkable for a blood pressure of 153/76 with a heart rate of 66, and  respiratory rate of 18. He is afebrile.   HEENT: Normocephalic, atraumatic. Pupils equally round and reactive to light and accommodation. Extraocular movements are intact. Sclerae anicteric. Conjunctivae are clear. Oropharynx is dry but clear.   NECK: Supple without jugular venous distention. No adenopathy or thyromegaly is noted.   LUNGS: Essentially clear to auscultation and percussion without wheezes, rales, or rhonchi. No dullness.   CARDIAC: Regular rate and rhythm with normal S1 and S2. No significant rubs or gallops present.   ABDOMEN: Soft, nontender, with normoactive bowel sounds. No organomegaly or masses were appreciated. No hernias or bruits were noted. Stool was guaiac negative in the Emergency Room per the Emergency Room physician.   EXTREMITIES: Without clubbing, cyanosis, or edema. Pulses were 1+ bilaterally.   SKIN: Warm and dry without rash or lesions.   NEUROLOGIC: Cranial nerves II through XII grossly intact. Deep tendon reflexes were symmetric. Motor and sensory exams nonfocal.   PSYCH: The patient was lethargic but would respond to painful stimuli and answers simple questions. He was not oriented to place or time.   LABORATORY DATA: Glucose was 159 with a BUN of 46 and a creatinine 1.62 with a GFR of 37. Calcium was 10.9. Troponin was 0.03. White count was 16.1 with a hemoglobin of 12.9. Urinalysis was negative. EKG revealed sinus rhythm with no acute ischemic changes.   ASSESSMENT:  1. Intractable nausea and vomiting.  2. Hematemesis.  3. Dehydration.  4. Leukocytosis.  5. Type 2  diabetes.  6. Altered mental status due to metabolic encephalopathy.  7. History of gout.   PLAN: The patient will be admitted to the floor as a NO CODE BLUE according to family members. He will be placed on IV fluids. Because of his leukocytosis, we will begin empiric IV antibiotics at this time. We will check abdominal films and a chest x-ray. We will guaiac all stools and follow his  hemoglobin closely. We will follow his sugars with Accu-Cheks before meals and at bedtime and add sliding scale insulin as needed. We will consult GI in regards to the patient's nausea, vomiting, and coffee-ground emesis. We will begin IV Protonix and empiric p.o. Reglan at this time. We will continue the majority of his outpatient medications except for his metformin and hydrochlorothiazide at this time. Further treatment and evaluation will depend upon the patient's progress.   TOTAL TIME SPENT: 50 minutes.   ____________________________ Duane LopeJeffrey D. Judithann SheenSparks, MD jds:bjt D: 06/07/2012 15:46:02 ET T: 06/07/2012 16:32:47 ET JOB#: [id number]  cc: Duane LopeJeffrey D. Judithann SheenSparks, MD, <Dictator> Teena Iraniavid M. Terance HartBronstein, MD Amiri Tritch Rodena Medin Yareni Creps MD ELECTRONICALLY SIGNED 06/07/2012 21:25
